# Patient Record
Sex: Female | Born: 1980 | Race: Black or African American | Hispanic: No | Marital: Single | Smoking: Never smoker
Health system: Southern US, Community
[De-identification: ages and names within clinical notes are randomized; demographics above are authoritative.]

## PROBLEM LIST (undated history)

## (undated) DIAGNOSIS — J45909 Unspecified asthma, uncomplicated: Secondary | ICD-10-CM

---

## 2010-04-27 ENCOUNTER — Emergency Department (HOSPITAL_COMMUNITY): Admission: EM | Admit: 2010-04-27 | Discharge: 2010-04-27 | Payer: Self-pay | Admitting: Emergency Medicine

## 2010-05-17 ENCOUNTER — Emergency Department (HOSPITAL_COMMUNITY): Admission: EM | Admit: 2010-05-17 | Discharge: 2010-05-17 | Payer: Self-pay | Admitting: Emergency Medicine

## 2010-05-17 ENCOUNTER — Encounter (INDEPENDENT_AMBULATORY_CARE_PROVIDER_SITE_OTHER): Payer: Self-pay | Admitting: Emergency Medicine

## 2010-08-25 ENCOUNTER — Emergency Department (HOSPITAL_COMMUNITY): Admission: EM | Admit: 2010-08-25 | Discharge: 2010-08-26 | Payer: Self-pay | Admitting: Emergency Medicine

## 2011-01-05 ENCOUNTER — Emergency Department (HOSPITAL_COMMUNITY)
Admission: EM | Admit: 2011-01-05 | Discharge: 2011-01-05 | Payer: Self-pay | Source: Home / Self Care | Admitting: Emergency Medicine

## 2011-01-06 LAB — GC/CHLAMYDIA PROBE AMP, GENITAL
Chlamydia, DNA Probe: NEGATIVE
GC Probe Amp, Genital: NEGATIVE

## 2011-01-06 LAB — URINE MICROSCOPIC-ADD ON

## 2011-01-06 LAB — URINALYSIS, ROUTINE W REFLEX MICROSCOPIC
Leukocytes, UA: NEGATIVE
Specific Gravity, Urine: 1.016 (ref 1.005–1.030)
Urine Glucose, Fasting: NEGATIVE mg/dL
pH: 6.5 (ref 5.0–8.0)

## 2011-02-25 LAB — POCT CARDIAC MARKERS
CKMB, poc: <1 ng/mL — ABNORMAL LOW (ref 1.0–8.0)
Myoglobin, poc: 38.6 ng/mL (ref 12–200)

## 2011-02-25 LAB — CBC
HCT: 32.1 % — ABNORMAL LOW (ref 36.0–46.0)
Hemoglobin: 10.6 g/dL — ABNORMAL LOW (ref 12.0–15.0)
RDW: 14.3 % (ref 11.5–15.5)
WBC: 6 10*3/uL (ref 4.0–10.5)

## 2011-02-25 LAB — DIFFERENTIAL
Basophils Absolute: 0 10*3/uL (ref 0.0–0.1)
Lymphocytes Relative: 40 % (ref 12–46)
Monocytes Absolute: 0.7 10*3/uL (ref 0.1–1.0)
Monocytes Relative: 11 % (ref 3–12)
Neutro Abs: 2.7 10*3/uL (ref 1.7–7.7)
Neutrophils Relative %: 46 % (ref 43–77)

## 2011-02-25 LAB — POCT I-STAT, CHEM 8
Creatinine, Ser: 0.7 mg/dL (ref 0.4–1.2)
Glucose, Bld: 104 mg/dL — ABNORMAL HIGH (ref 70–99)
HCT: 34 % — ABNORMAL LOW (ref 36.0–46.0)
Hemoglobin: 11.6 g/dL — ABNORMAL LOW (ref 12.0–15.0)
Potassium: 2.8 mEq/L — ABNORMAL LOW (ref 3.5–5.1)
TCO2: 24 mmol/L (ref 0–100)

## 2011-03-01 LAB — URINALYSIS, ROUTINE W REFLEX MICROSCOPIC
Bilirubin Urine: NEGATIVE
Ketones, ur: NEGATIVE mg/dL
Nitrite: NEGATIVE
Protein, ur: NEGATIVE mg/dL
Specific Gravity, Urine: 1.022 (ref 1.005–1.030)
Urobilinogen, UA: 0.2 mg/dL (ref 0.0–1.0)

## 2011-03-01 LAB — WET PREP, GENITAL: Yeast Wet Prep HPF POC: NONE SEEN

## 2011-03-01 LAB — CBC
Hemoglobin: 11.5 g/dL — ABNORMAL LOW (ref 12.0–15.0)
MCHC: 33 g/dL (ref 30.0–36.0)
Platelets: 283 10*3/uL (ref 150–400)
RDW: 14.1 % (ref 11.5–15.5)

## 2011-03-01 LAB — GC/CHLAMYDIA PROBE AMP, GENITAL
Chlamydia, DNA Probe: NEGATIVE
GC Probe Amp, Genital: NEGATIVE

## 2011-03-01 LAB — POCT PREGNANCY, URINE: Preg Test, Ur: NEGATIVE

## 2011-03-01 LAB — URINE MICROSCOPIC-ADD ON

## 2011-08-27 IMAGING — CR DG CHEST 2V
2 series · 2 of 2 positions shown · non-contrast
Comparison: None.

CLINICAL DATA: Shortness of breath, history of asthma

CHEST - 2 VIEW

[w chest pa]
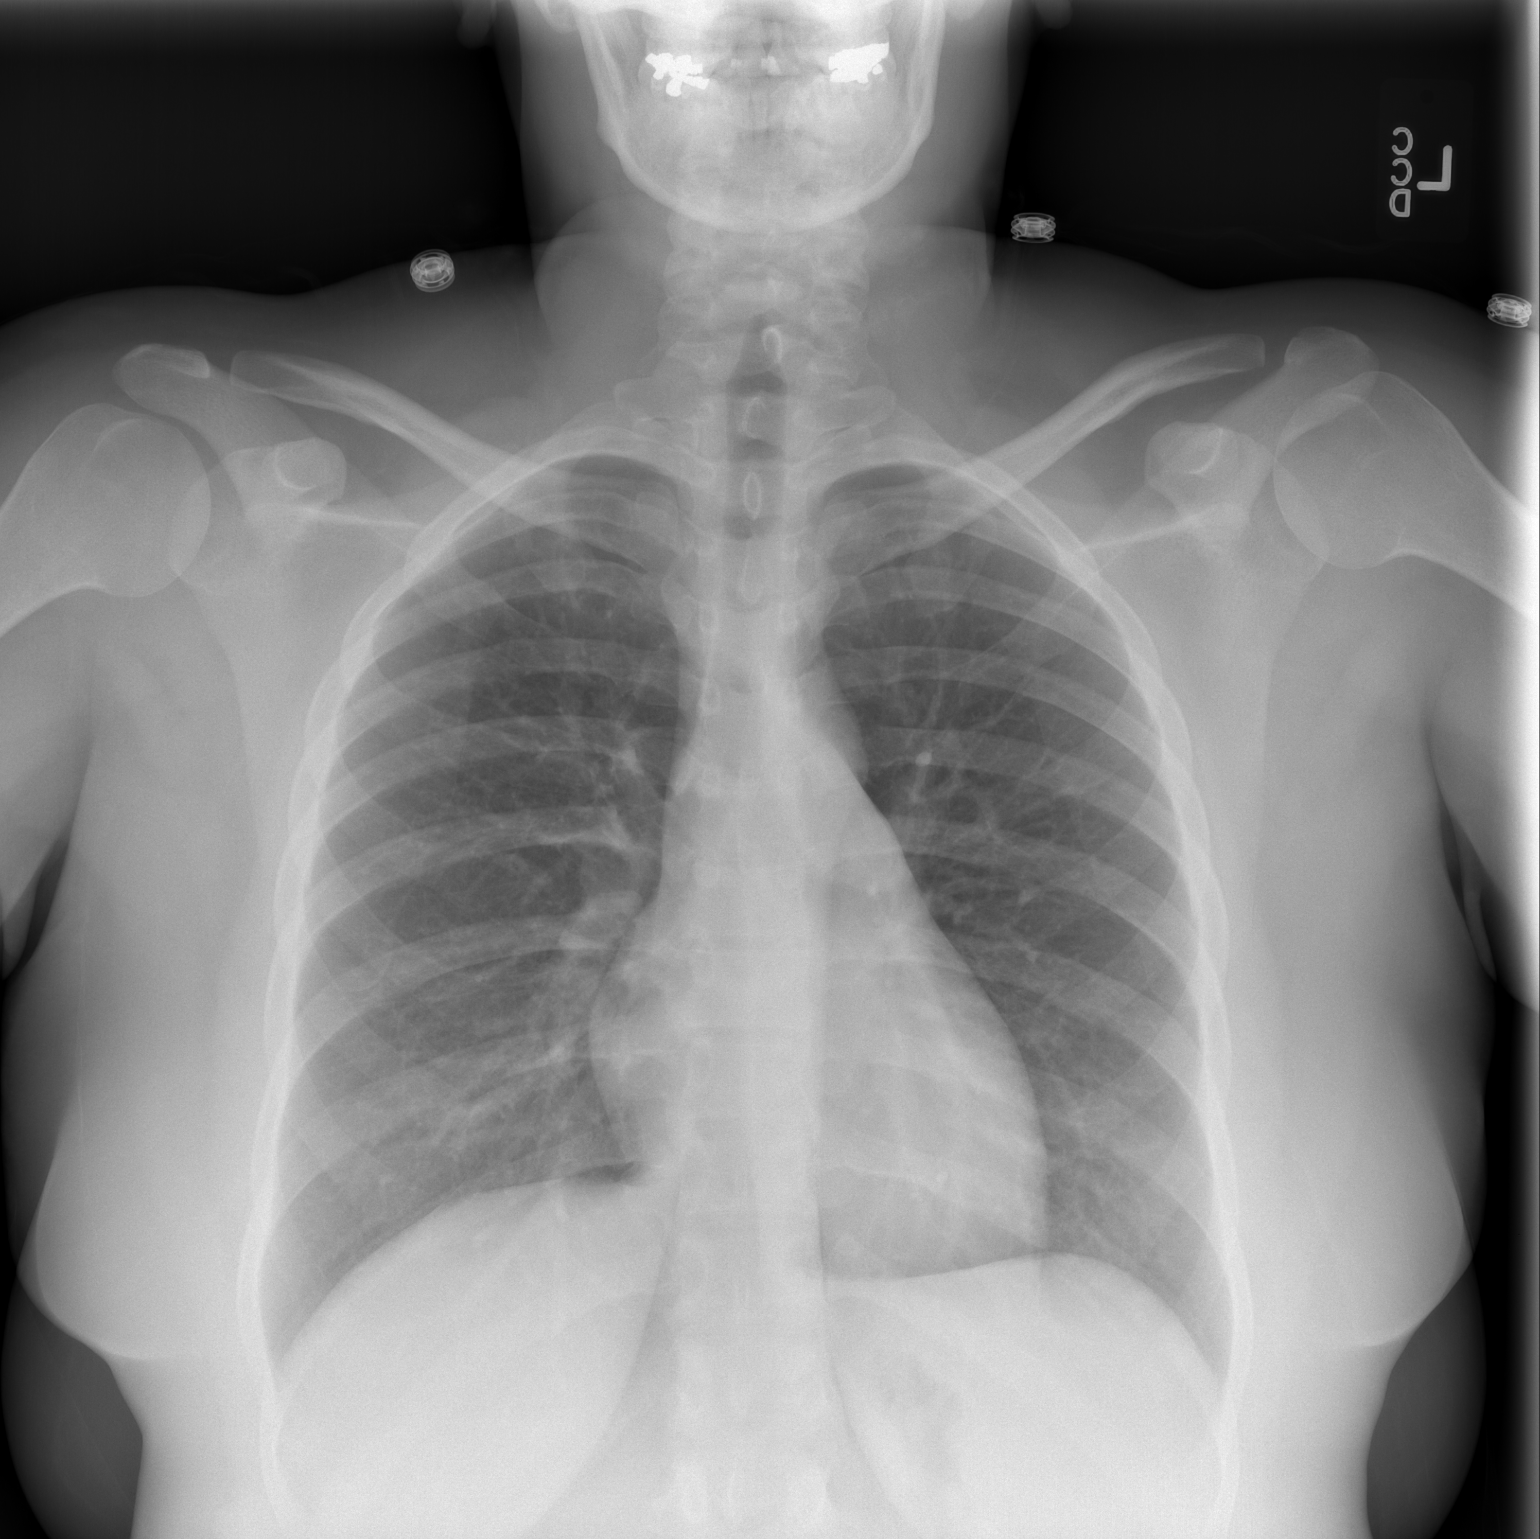

[w chest lat]
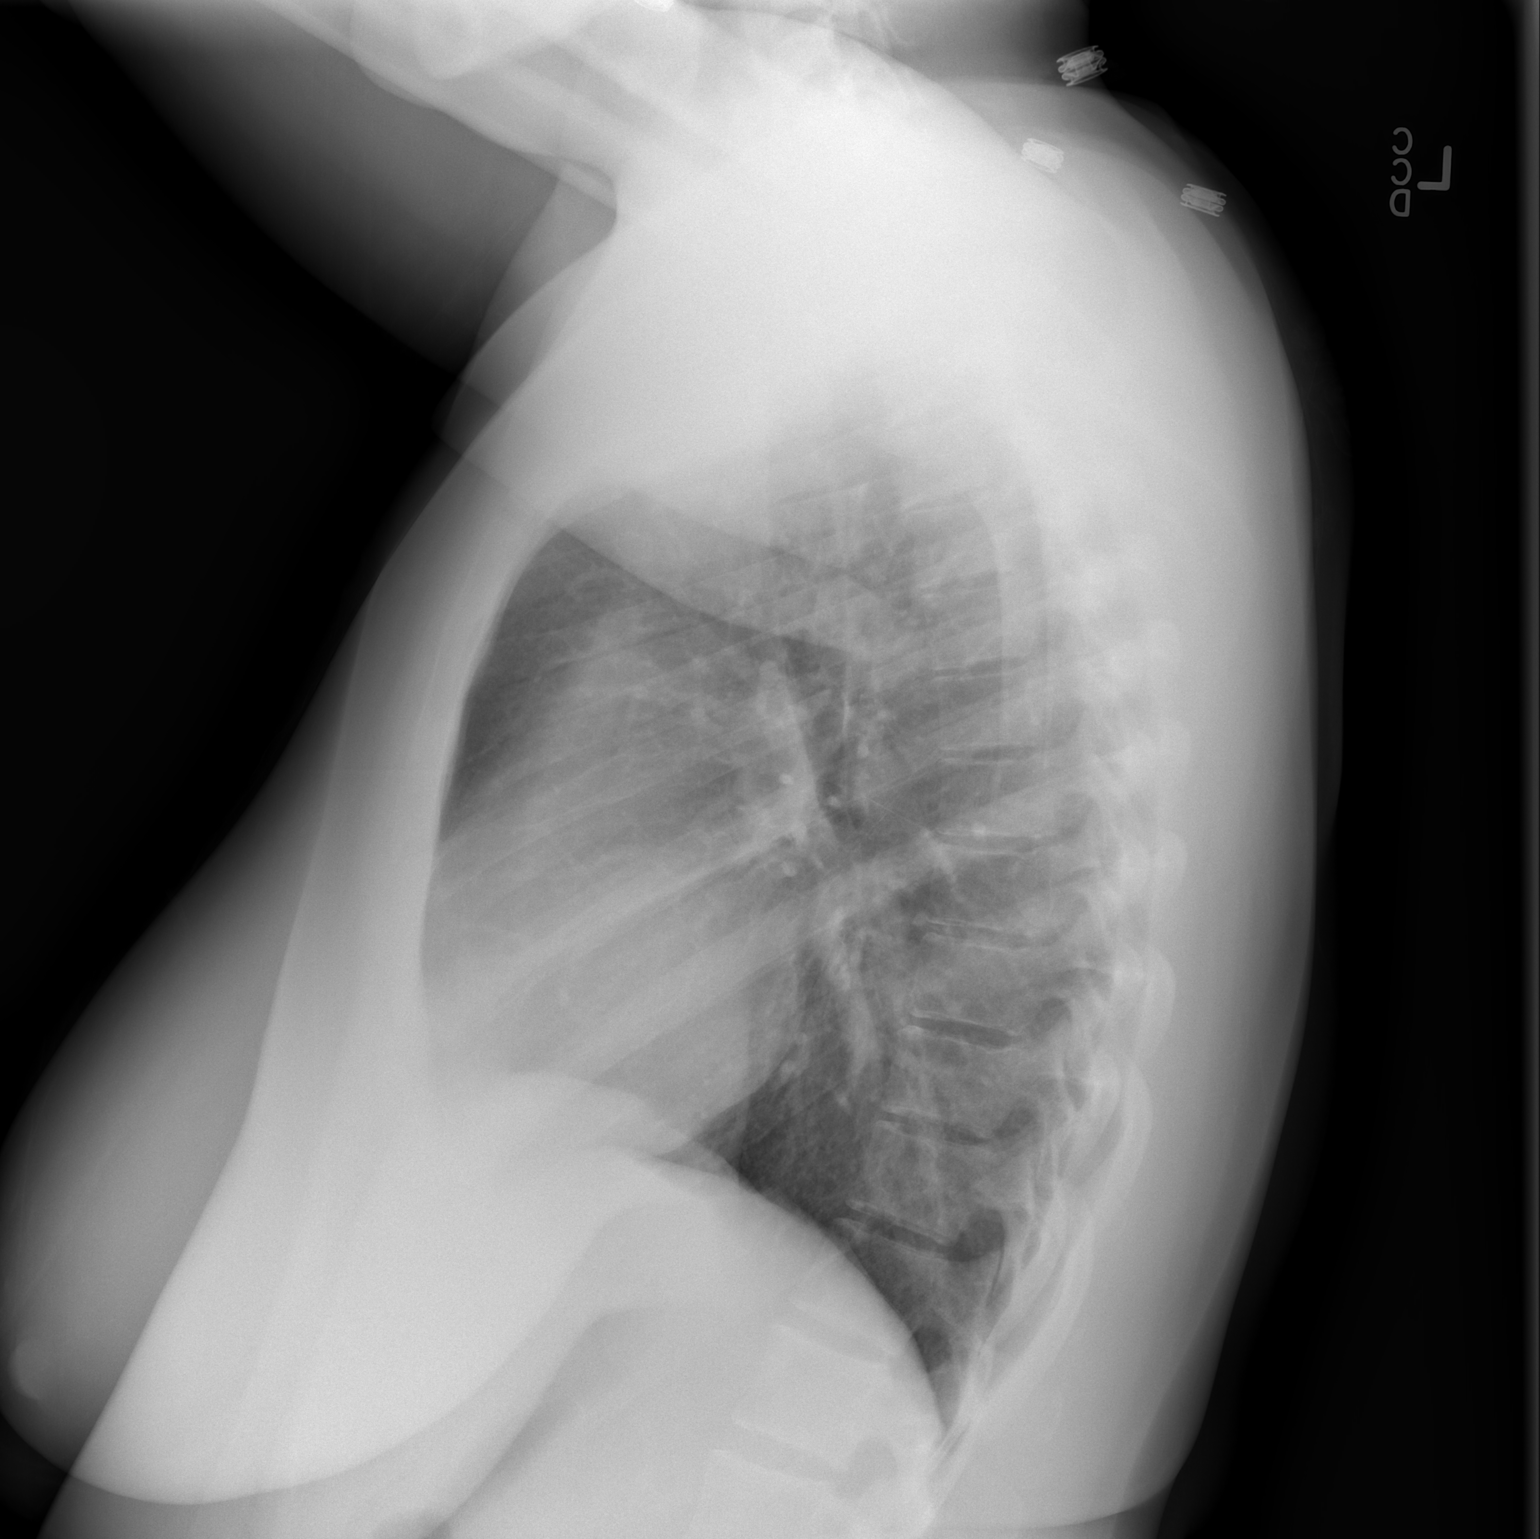

[2 of 2 positions shown; findings below may reference images not displayed]

FINDINGS: The lungs are clear.  Mediastinal contours are normal.
The heart is within normal limits in size.
IMPRESSION: No active lung disease.

## 2016-05-11 ENCOUNTER — Encounter (HOSPITAL_COMMUNITY): Payer: Self-pay | Admitting: Emergency Medicine

## 2016-05-11 ENCOUNTER — Emergency Department (HOSPITAL_COMMUNITY)
Admission: EM | Admit: 2016-05-11 | Discharge: 2016-05-11 | Disposition: A | Discharge status: Home / Self Care | Payer: Medicaid - Out of State | Attending: Emergency Medicine | Admitting: Emergency Medicine

## 2016-05-11 DIAGNOSIS — W57XXXA Bitten or stung by nonvenomous insect and other nonvenomous arthropods, initial encounter: Secondary | ICD-10-CM | POA: Diagnosis not present

## 2016-05-11 DIAGNOSIS — S40862A Insect bite (nonvenomous) of left upper arm, initial encounter: Secondary | ICD-10-CM | POA: Diagnosis not present

## 2016-05-11 DIAGNOSIS — S30860A Insect bite (nonvenomous) of lower back and pelvis, initial encounter: Secondary | ICD-10-CM | POA: Insufficient documentation

## 2016-05-11 DIAGNOSIS — S20319A Abrasion of unspecified front wall of thorax, initial encounter: Secondary | ICD-10-CM | POA: Diagnosis not present

## 2016-05-11 DIAGNOSIS — Y92009 Unspecified place in unspecified non-institutional (private) residence as the place of occurrence of the external cause: Secondary | ICD-10-CM | POA: Insufficient documentation

## 2016-05-11 DIAGNOSIS — S20369A Insect bite (nonvenomous) of unspecified front wall of thorax, initial encounter: Secondary | ICD-10-CM | POA: Insufficient documentation

## 2016-05-11 DIAGNOSIS — Z880 Allergy status to penicillin: Secondary | ICD-10-CM | POA: Insufficient documentation

## 2016-05-11 DIAGNOSIS — R21 Rash and other nonspecific skin eruption: Secondary | ICD-10-CM

## 2016-05-11 DIAGNOSIS — J45909 Unspecified asthma, uncomplicated: Secondary | ICD-10-CM | POA: Diagnosis not present

## 2016-05-11 DIAGNOSIS — Y998 Other external cause status: Secondary | ICD-10-CM | POA: Diagnosis not present

## 2016-05-11 DIAGNOSIS — S40861A Insect bite (nonvenomous) of right upper arm, initial encounter: Secondary | ICD-10-CM | POA: Diagnosis not present

## 2016-05-11 DIAGNOSIS — Y9389 Activity, other specified: Secondary | ICD-10-CM | POA: Insufficient documentation

## 2016-05-11 HISTORY — DX: Unspecified asthma, uncomplicated: J45.909

## 2016-05-11 MED ORDER — TRIAMCINOLONE ACETONIDE 0.1 % EX CREA: 1.0000 "application " | TOPICAL_CREAM | Freq: Two times a day (BID) | CUTANEOUS | Status: AC

## 2016-05-11 NOTE — ED Notes (Signed)
Pt c/o possible bed bugs. Onset this am. Small bites to arms and chest.

## 2016-05-11 NOTE — ED Provider Notes (Signed)
CSN: [id number]     Arrival date & time 05/11/16  1006 History  By signing my name below, I, [name], attest that this documentation has been prepared under the direction and in the presence of Wells FargoKelly Caio Devera, PA-C.  Electronically Signed: [name], ED Scribe. 05/11/2016. 10:52 AM.   No chief complaint on file.  The history is provided by the patient. No language interpreter was used.    HPI Comments: Olivia Warren is a 35 y.o. female who presents to the Emergency Department complaining of itching bug bites to the chest, back, and bilateral arms that pt noticed this morning. She mentions having similar symptoms a couple of months ago when she noticed bed bugs in her house. Pt's friend used various remedies to get rid of the bed bugs a couple of months ago and pt has not noticed any bugs since. Pt admits to scratching the areas. Her children have similar bites to their bodies. Denies any other associated symptoms.   Past Medical History  Diagnosis Date  . Asthma    Past Surgical History  Procedure Laterality Date  . Cesarean section     No family history on file. Social History  Substance Use Topics  . Smoking status: Never Smoker   . Smokeless tobacco: None  . Alcohol Use: No   OB History    No data available     Review of Systems  Constitutional: Negative for fever.  Skin:       + itching bug bites to arms, back, and chest   Allergies  Penicillins  Home Medications   Prior to Admission medications   Not on File   BP 119/73 mmHg  Pulse 90  Temp(Src) 99.3 F (37.4 C) (Oral)  Resp 18  SpO2 100%   Physical Exam  Constitutional: She is oriented to person, place, and time. She appears well-developed and well-nourished. No distress.  HENT:  Head: Normocephalic and atraumatic.  Eyes: Conjunctivae are normal. Pupils are equal, round, and reactive to light. Right eye exhibits no discharge. Left eye exhibits no discharge. No scleral icterus.  Neck: Normal range of  motion.  Pulmonary/Chest: Effort normal.  Neurological: She is alert and oriented to person, place, and time.  Skin: Skin is warm and dry. Rash noted.  Various bite marks on back, arms, and upper chest with excoriation from scratching  Psychiatric: She has a normal mood and affect.    ED Course  Procedures (including critical care time)  DIAGNOSTIC STUDIES: Oxygen Saturation is 100% on RA, normal by my interpretation.    COORDINATION OF CARE: 10:42 AM-Discussed treatment plan which includes steroid cream with pt at bedside and pt agreed to plan.    MDM   Final diagnoses:  Rash   35 year old female presents with rash possibly due to bug bites. She is afebrile, not tachycardic or tachypneic, normotensive, and not hypoxic. Will rx Triamcinolone for itchiness. Patient is NAD, non-toxic, with stable VS. Patient is informed of clinical course, understands medical decision making process, and agrees with plan. Opportunity for questions provided and all questions answered. Return precautions given.  I personally performed the services described in this documentation, which was scribed in my presence. The recorded information has been reviewed and is accurate.      Bethel BornKelly Marie Cristy Colmenares, PA-C 05/11/16 1251  Doug SouSam Jacubowitz, MD 05/11/16 [phone]
